# Patient Record
Sex: Female | Born: 1935 | Race: White | Hispanic: No
Health system: Southern US, Community
[De-identification: ages and names within clinical notes are randomized; demographics above are authoritative.]

---

## 2005-04-14 ENCOUNTER — Ambulatory Visit: Payer: Self-pay | Admitting: Internal Medicine

## 2005-12-03 ENCOUNTER — Ambulatory Visit: Payer: Self-pay | Admitting: Internal Medicine

## 2006-04-21 ENCOUNTER — Ambulatory Visit: Payer: Self-pay | Admitting: Internal Medicine

## 2006-09-02 ENCOUNTER — Ambulatory Visit: Payer: Self-pay | Admitting: Unknown Physician Specialty

## 2007-05-04 ENCOUNTER — Ambulatory Visit: Payer: Self-pay | Admitting: Internal Medicine

## 2007-09-02 ENCOUNTER — Ambulatory Visit: Payer: Self-pay

## 2008-04-18 ENCOUNTER — Ambulatory Visit: Payer: Self-pay | Admitting: Unknown Physician Specialty

## 2008-05-25 ENCOUNTER — Ambulatory Visit: Payer: Self-pay | Admitting: Internal Medicine

## 2009-06-06 ENCOUNTER — Ambulatory Visit: Payer: Self-pay | Admitting: Internal Medicine

## 2010-02-26 ENCOUNTER — Emergency Department: Payer: Self-pay | Admitting: Emergency Medicine

## 2010-06-17 ENCOUNTER — Ambulatory Visit: Payer: Self-pay | Admitting: Internal Medicine

## 2011-07-30 ENCOUNTER — Ambulatory Visit: Payer: Self-pay | Admitting: Internal Medicine

## 2011-10-30 ENCOUNTER — Ambulatory Visit: Payer: Self-pay | Admitting: Unknown Physician Specialty

## 2012-03-07 ENCOUNTER — Emergency Department: Payer: Self-pay | Admitting: Emergency Medicine

## 2012-03-07 LAB — CBC
MCH: 31.6 pg (ref 26.0–34.0)
Platelet: 230 10*3/uL (ref 150–440)
RBC: 4.03 10*6/uL (ref 3.80–5.20)
WBC: 8.5 10*3/uL (ref 3.6–11.0)

## 2012-03-07 LAB — COMPREHENSIVE METABOLIC PANEL
Albumin: 3.6 g/dL (ref 3.4–5.0)
Alkaline Phosphatase: 61 U/L (ref 50–136)
BUN: 7 mg/dL (ref 7–18)
Bilirubin,Total: 0.6 mg/dL (ref 0.2–1.0)
Calcium, Total: 8.3 mg/dL — ABNORMAL LOW (ref 8.5–10.1)
Creatinine: 0.56 mg/dL — ABNORMAL LOW (ref 0.60–1.30)
Osmolality: 248 (ref 275–301)
Potassium: 4 mmol/L (ref 3.5–5.1)
Sodium: 124 mmol/L — ABNORMAL LOW (ref 136–145)
Total Protein: 6.3 g/dL — ABNORMAL LOW (ref 6.4–8.2)

## 2012-03-07 LAB — TROPONIN I: Troponin-I: <0.02 ng/mL

## 2012-03-07 LAB — CK TOTAL AND CKMB (NOT AT ARMC): CK-MB: 1.9 ng/mL (ref 0.5–3.6)

## 2012-03-17 ENCOUNTER — Ambulatory Visit: Payer: Self-pay | Admitting: Unknown Physician Specialty

## 2012-05-06 ENCOUNTER — Ambulatory Visit: Payer: Self-pay | Admitting: Unknown Physician Specialty

## 2012-12-20 ENCOUNTER — Emergency Department: Payer: Self-pay | Admitting: Unknown Physician Specialty

## 2012-12-20 LAB — COMPREHENSIVE METABOLIC PANEL
Albumin: 3.9 g/dL (ref 3.4–5.0)
Alkaline Phosphatase: 79 U/L (ref 50–136)
Anion Gap: 6 — ABNORMAL LOW (ref 7–16)
BUN: 13 mg/dL (ref 7–18)
Bilirubin,Total: 0.4 mg/dL (ref 0.2–1.0)
Calcium, Total: 8.8 mg/dL (ref 8.5–10.1)
Chloride: 101 mmol/L (ref 98–107)
Co2: 29 mmol/L (ref 21–32)
Creatinine: 0.7 mg/dL (ref 0.60–1.30)
EGFR (African American): >60
EGFR (Non-African Amer.): >60
Glucose: 102 mg/dL — ABNORMAL HIGH (ref 65–99)
Potassium: 3.7 mmol/L (ref 3.5–5.1)
Sodium: 136 mmol/L (ref 136–145)

## 2012-12-20 LAB — URINALYSIS, COMPLETE
Bacteria: NONE SEEN
Glucose,UR: NEGATIVE mg/dL (ref 0–75)
Ketone: NEGATIVE
Nitrite: NEGATIVE
Protein: NEGATIVE
Specific Gravity: 1.008 (ref 1.003–1.030)
Squamous Epithelial: 1

## 2012-12-20 LAB — CBC
HGB: 13.9 g/dL (ref 12.0–16.0)
MCH: 31.6 pg (ref 26.0–34.0)
MCHC: 34.1 g/dL (ref 32.0–36.0)
MCV: 93 fL (ref 80–100)
Platelet: 283 10*3/uL (ref 150–440)
RBC: 4.38 10*6/uL (ref 3.80–5.20)
RDW: 13.2 % (ref 11.5–14.5)
WBC: 6.1 10*3/uL (ref 3.6–11.0)

## 2012-12-20 LAB — TROPONIN I: Troponin-I: <0.02 ng/mL

## 2012-12-20 LAB — MAGNESIUM: Magnesium: 1.8 mg/dL

## 2013-08-10 ENCOUNTER — Ambulatory Visit: Payer: Self-pay | Admitting: Family Medicine

## 2013-08-13 IMAGING — US ABDOMEN ULTRASOUND LIMITED
1 series · 13 of 25 positions shown · non-contrast
Comparison: none

REASON FOR EXAM: abd pain
COMMENTS:   Body Site: GB and Fossa, CBD, Head of Pancreas

[Series 1: abdomen ultrasound limited · 0.31mm/px · 13 of 55 slices shown]
[im 1/55]
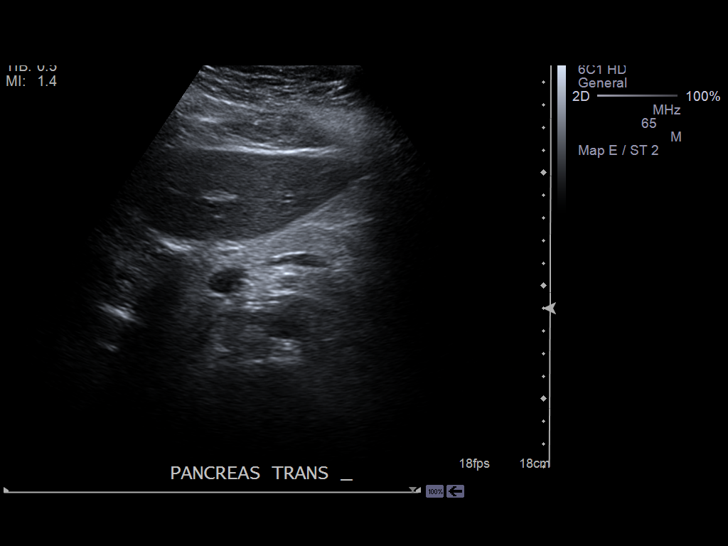
[im 5/55]
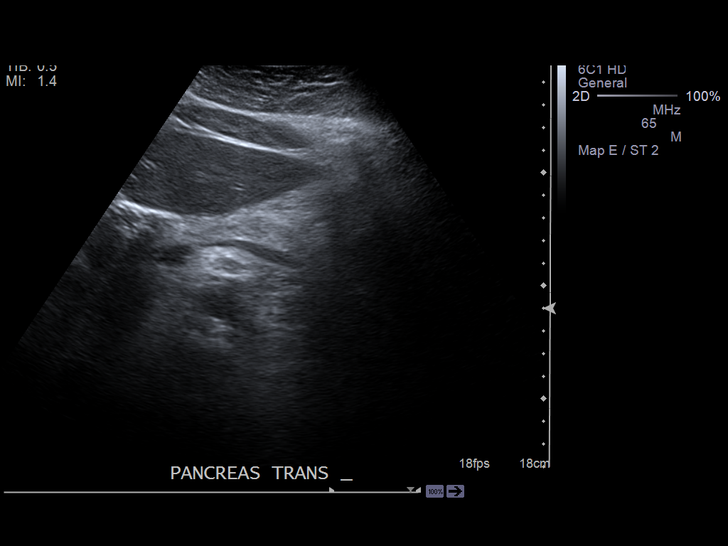
[im 10/55]
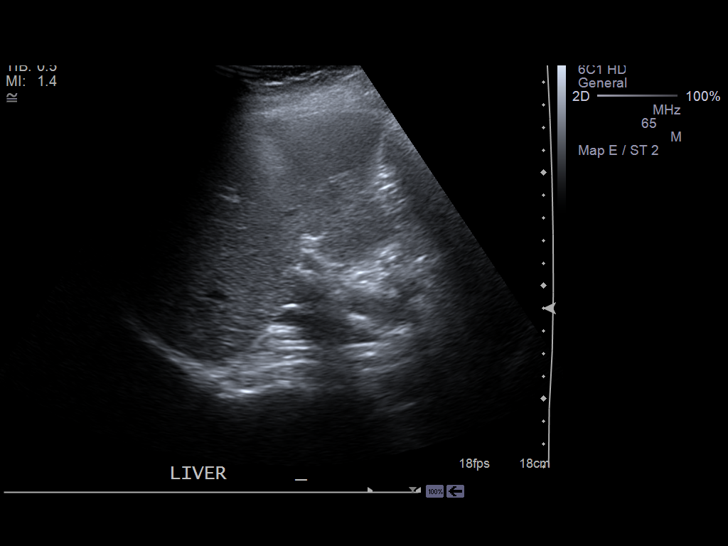
[im 14/55]
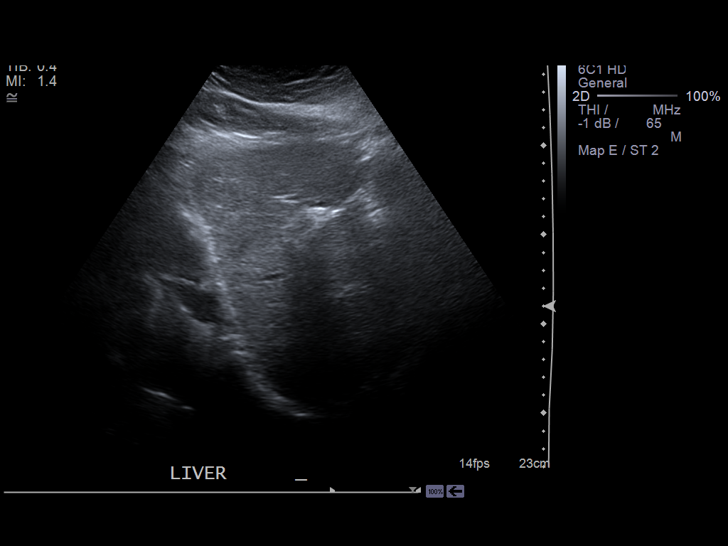
[im 19/55]
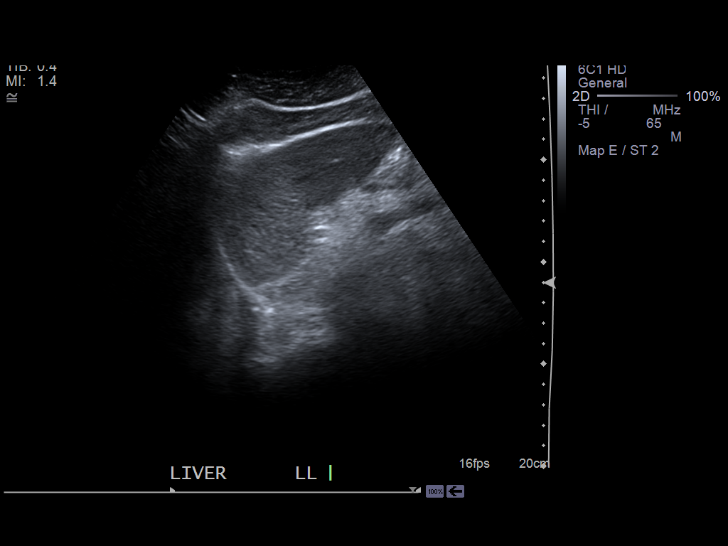
[im 23/55]
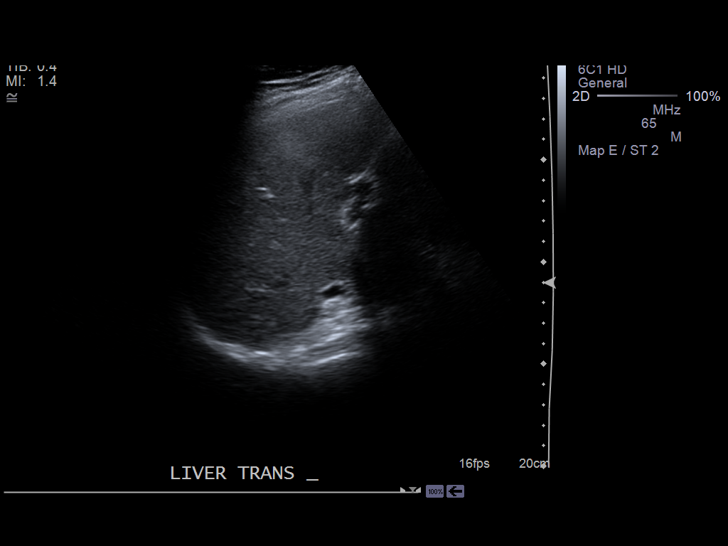
[im 28/55]
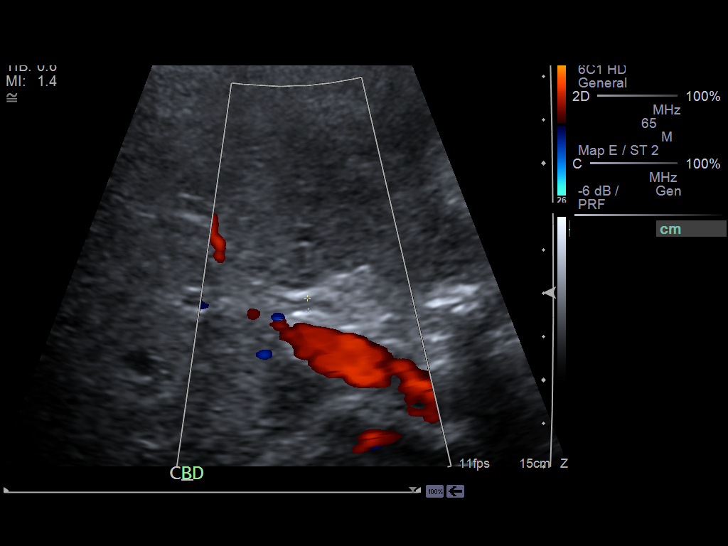
[im 32/55]
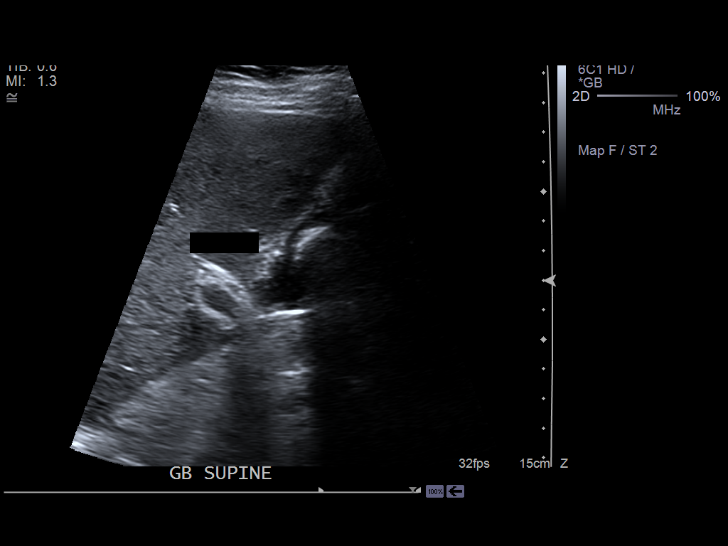
[im 37/55]
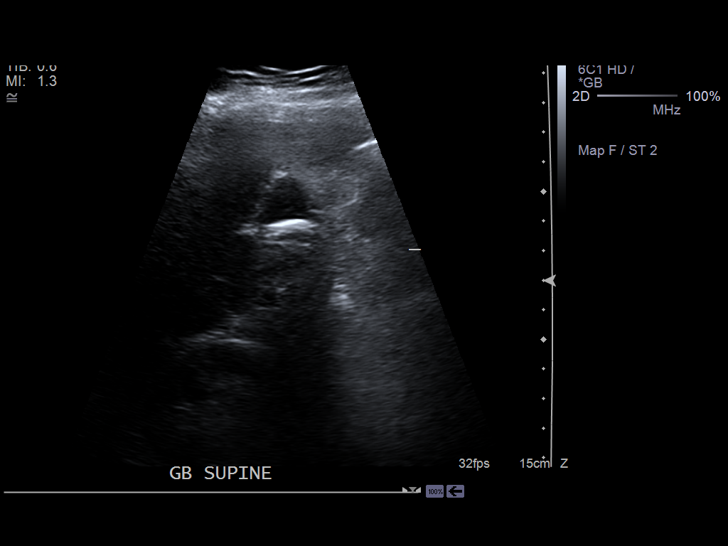
[im 41/55]
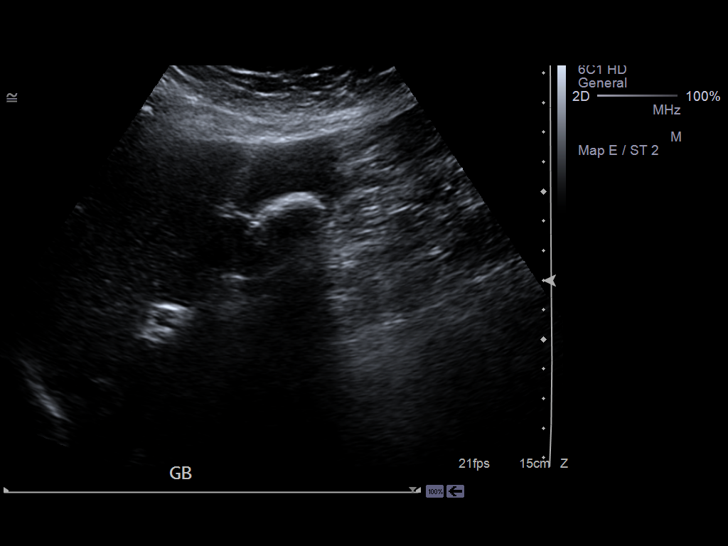
[im 46/55]
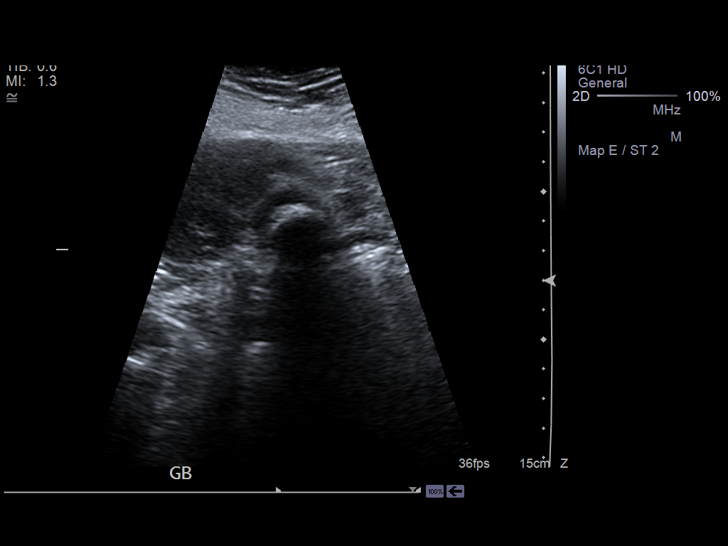
[im 50/55]
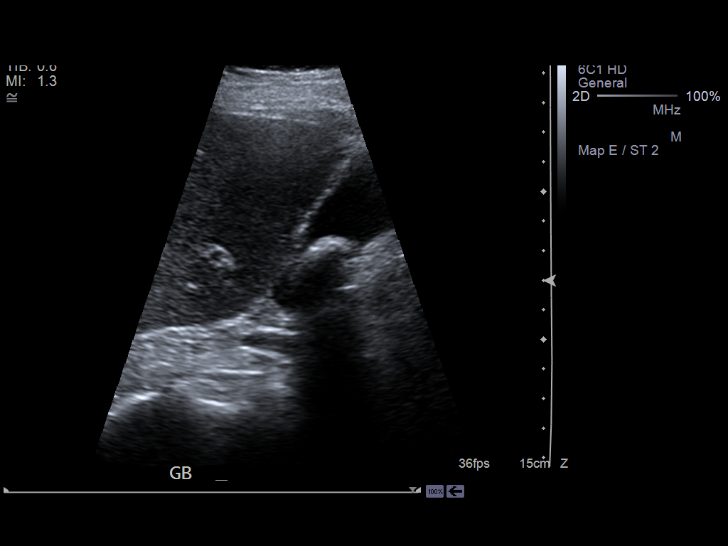
[im 55/55]
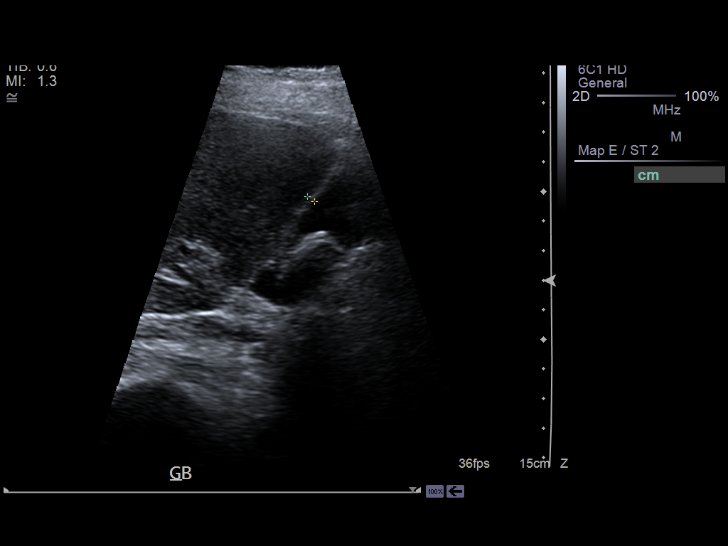

[13 of 25 positions shown; findings below may reference images not displayed]

PROCEDURE:     US  - US ABDOMEN LIMITED SURVEY  - December 20, 2012 [DATE]

RESULT:     The pancreas is predominantly the visualized with portions of
the tail and head were not well seen. The visualized pancreas is
unremarkable. The hepatic echotexture is normal the liver length of 14.30 cm
demonstrated. The portal venous flow is normal. There is no intrahepatic or
extrahepatic biliary ductal dilation. The common bile duct diameter is
mm. Cholelithiasis is present with at least one large stone present a wall
thickness is approximately 2.9 to 3.3 mm with some areas concerning for
edematous appearance. Correlate for acute cholecystitis. There was however,
a negative sonographic Murphy's sign. The stone was mobile with changes in
patient position.
IMPRESSION: Cholelithiasis. There appears to be a single large stone
measuring at least 2.9 cm which is mobile. The gallbladder wall is thickened
without is positive sonographic Murphy's sign. Changes of early
cholecystitis or not excluded. Close clinical and laboratory correlation is
recommended. Surgical consultation may be beneficial.

[REDACTED]

## 2014-04-01 ENCOUNTER — Emergency Department: Payer: Self-pay | Admitting: Emergency Medicine

## 2014-04-01 LAB — COMPREHENSIVE METABOLIC PANEL
ALK PHOS: 56 U/L
ALT: 26 U/L (ref 12–78)
Albumin: 3.4 g/dL (ref 3.4–5.0)
Anion Gap: 7 (ref 7–16)
BUN: 11 mg/dL (ref 7–18)
Bilirubin,Total: 1 mg/dL (ref 0.2–1.0)
CALCIUM: 9.1 mg/dL (ref 8.5–10.1)
CHLORIDE: 105 mmol/L (ref 98–107)
CREATININE: 0.88 mg/dL (ref 0.60–1.30)
Co2: 25 mmol/L (ref 21–32)
GFR CALC NON AF AMER: 60 — AB
Glucose: 99 mg/dL (ref 65–99)
Osmolality: 273 (ref 275–301)
Potassium: 4.2 mmol/L (ref 3.5–5.1)
SGOT(AST): 24 U/L (ref 15–37)
Sodium: 137 mmol/L (ref 136–145)
Total Protein: 6.6 g/dL (ref 6.4–8.2)

## 2014-04-01 LAB — CBC
HCT: 39.6 % (ref 35.0–47.0)
HGB: 13.6 g/dL (ref 12.0–16.0)
MCH: 31.4 pg (ref 26.0–34.0)
MCHC: 34.3 g/dL (ref 32.0–36.0)
MCV: 92 fL (ref 80–100)
PLATELETS: 245 10*3/uL (ref 150–440)
RBC: 4.33 10*6/uL (ref 3.80–5.20)
RDW: 13.5 % (ref 11.5–14.5)
WBC: 7.3 10*3/uL (ref 3.6–11.0)

## 2014-04-01 LAB — TROPONIN I: Troponin-I: <0.02 ng/mL

## 2014-04-01 LAB — CK TOTAL AND CKMB (NOT AT ARMC)
CK, Total: 53 U/L
CK-MB: 0.9 ng/mL (ref 0.5–3.6)

## 2014-04-07 ENCOUNTER — Ambulatory Visit: Payer: Self-pay | Admitting: Unknown Physician Specialty

## 2014-04-08 LAB — CBC
HCT: 39.5 % (ref 35.0–47.0)
HGB: 13.6 g/dL (ref 12.0–16.0)
MCH: 31.1 pg (ref 26.0–34.0)
MCHC: 34.3 g/dL (ref 32.0–36.0)
MCV: 91 fL (ref 80–100)
PLATELETS: 245 10*3/uL (ref 150–440)
RBC: 4.36 10*6/uL (ref 3.80–5.20)
RDW: 13.2 % (ref 11.5–14.5)
WBC: 10.5 10*3/uL (ref 3.6–11.0)

## 2014-04-09 ENCOUNTER — Inpatient Hospital Stay: Payer: Self-pay | Admitting: Internal Medicine

## 2014-04-09 LAB — BASIC METABOLIC PANEL
Anion Gap: 8 (ref 7–16)
BUN: 7 mg/dL (ref 7–18)
CREATININE: 0.73 mg/dL (ref 0.60–1.30)
Calcium, Total: 8.9 mg/dL (ref 8.5–10.1)
Chloride: 93 mmol/L — ABNORMAL LOW (ref 98–107)
Co2: 24 mmol/L (ref 21–32)
EGFR (African American): >60
EGFR (Non-African Amer.): >60
GLUCOSE: 101 mg/dL — AB (ref 65–99)
Osmolality: 250 (ref 275–301)
POTASSIUM: 3.7 mmol/L (ref 3.5–5.1)
Sodium: 125 mmol/L — ABNORMAL LOW (ref 136–145)

## 2014-04-09 LAB — HEPATIC FUNCTION PANEL A (ARMC)
ALBUMIN: 3.9 g/dL (ref 3.4–5.0)
ALK PHOS: 60 U/L
ALT: 29 U/L (ref 12–78)
Bilirubin, Direct: 0.1 mg/dL (ref 0.00–0.20)
Bilirubin,Total: 0.5 mg/dL (ref 0.2–1.0)
SGOT(AST): 29 U/L (ref 15–37)
Total Protein: 7.1 g/dL (ref 6.4–8.2)

## 2014-04-09 LAB — MAGNESIUM: MAGNESIUM: 1.5 mg/dL — AB

## 2014-04-09 LAB — CK TOTAL AND CKMB (NOT AT ARMC)
CK, TOTAL: 98 U/L
CK, Total: 128 U/L
CK, Total: 108 U/L
CK-MB: 3.2 ng/mL (ref 0.5–3.6)
CK-MB: 2.7 ng/mL (ref 0.5–3.6)
CK-MB: 2.5 ng/mL (ref 0.5–3.6)

## 2014-04-09 LAB — HEMOGLOBIN A1C: HEMOGLOBIN A1C: 6 % (ref 4.2–6.3)

## 2014-04-09 LAB — URINALYSIS, COMPLETE
BACTERIA: NONE SEEN
Bilirubin,UR: NEGATIVE
GLUCOSE, UR: NEGATIVE mg/dL (ref 0–75)
Ketone: NEGATIVE
Leukocyte Esterase: NEGATIVE
NITRITE: NEGATIVE
PH: 7 (ref 4.5–8.0)
Protein: NEGATIVE
RBC,UR: <1 /HPF (ref 0–5)
SQUAMOUS EPITHELIAL: NONE SEEN
Specific Gravity: 1.002 (ref 1.003–1.030)
WBC UR: NONE SEEN /HPF (ref 0–5)

## 2014-04-09 LAB — TROPONIN I
Troponin-I: 0.03 ng/mL
Troponin-I: 0.02 ng/mL

## 2014-04-09 LAB — SODIUM
SODIUM: 132 mmol/L — AB (ref 136–145)
Sodium: 136 mmol/L (ref 136–145)

## 2014-04-09 LAB — OSMOLALITY, URINE: Osmolality: 77 mOsm/kg

## 2014-04-09 LAB — LIPASE, BLOOD: Lipase: 212 U/L (ref 73–393)

## 2014-04-09 LAB — FOLATE: Folic Acid: 36.8 ng/mL (ref 3.1–100.0)

## 2014-04-10 LAB — CBC WITH DIFFERENTIAL/PLATELET
BASOS ABS: 0 10*3/uL (ref 0.0–0.1)
Basophil %: 0.7 %
EOS ABS: 0.1 10*3/uL (ref 0.0–0.7)
Eosinophil %: 2.4 %
HCT: 37.9 % (ref 35.0–47.0)
HGB: 12.4 g/dL (ref 12.0–16.0)
Lymphocyte #: 2.3 10*3/uL (ref 1.0–3.6)
Lymphocyte %: 40.5 %
MCH: 30.7 pg (ref 26.0–34.0)
MCHC: 32.6 g/dL (ref 32.0–36.0)
MCV: 94 fL (ref 80–100)
MONO ABS: 0.6 x10 3/mm (ref 0.2–0.9)
MONOS PCT: 10.3 %
Neutrophil #: 2.6 10*3/uL (ref 1.4–6.5)
Neutrophil %: 46.1 %
Platelet: 233 10*3/uL (ref 150–440)
RBC: 4.03 10*6/uL (ref 3.80–5.20)
RDW: 13.7 % (ref 11.5–14.5)
WBC: 5.6 10*3/uL (ref 3.6–11.0)

## 2014-04-10 LAB — BASIC METABOLIC PANEL
ANION GAP: 8 (ref 7–16)
BUN: 6 mg/dL — AB (ref 7–18)
CALCIUM: 8.5 mg/dL (ref 8.5–10.1)
CHLORIDE: 104 mmol/L (ref 98–107)
Co2: 26 mmol/L (ref 21–32)
Creatinine: 0.63 mg/dL (ref 0.60–1.30)
EGFR (African American): >60
EGFR (Non-African Amer.): >60
Glucose: 94 mg/dL (ref 65–99)
Osmolality: 273 (ref 275–301)
Potassium: 3.9 mmol/L (ref 3.5–5.1)
Sodium: 138 mmol/L (ref 136–145)

## 2014-04-10 LAB — TSH: Thyroid Stimulating Horm: 1.5 u[IU]/mL

## 2014-04-10 LAB — MAGNESIUM: Magnesium: 2 mg/dL

## 2014-04-11 LAB — PATHOLOGY REPORT

## 2014-04-13 ENCOUNTER — Emergency Department: Payer: Self-pay | Admitting: Emergency Medicine

## 2014-04-13 LAB — LIPASE, BLOOD: Lipase: 183 U/L (ref 73–393)

## 2014-04-13 LAB — COMPREHENSIVE METABOLIC PANEL
ALK PHOS: 62 U/L
Albumin: 3.7 g/dL (ref 3.4–5.0)
Anion Gap: 6 — ABNORMAL LOW (ref 7–16)
BUN: 10 mg/dL (ref 7–18)
Bilirubin,Total: 0.4 mg/dL (ref 0.2–1.0)
Calcium, Total: 9.4 mg/dL (ref 8.5–10.1)
Chloride: 100 mmol/L (ref 98–107)
Co2: 28 mmol/L (ref 21–32)
Creatinine: 0.83 mg/dL (ref 0.60–1.30)
EGFR (Non-African Amer.): >60
Glucose: 98 mg/dL (ref 65–99)
Osmolality: 267 (ref 275–301)
Potassium: 3.9 mmol/L (ref 3.5–5.1)
SGOT(AST): 35 U/L (ref 15–37)
SGPT (ALT): 41 U/L (ref 12–78)
Sodium: 134 mmol/L — ABNORMAL LOW (ref 136–145)
TOTAL PROTEIN: 7.3 g/dL (ref 6.4–8.2)

## 2014-04-13 LAB — CBC
HCT: 42.2 % (ref 35.0–47.0)
HGB: 13.6 g/dL (ref 12.0–16.0)
MCH: 30 pg (ref 26.0–34.0)
MCHC: 32.3 g/dL (ref 32.0–36.0)
MCV: 93 fL (ref 80–100)
PLATELETS: 287 10*3/uL (ref 150–440)
RBC: 4.54 10*6/uL (ref 3.80–5.20)
RDW: 13.1 % (ref 11.5–14.5)
WBC: 5.8 10*3/uL (ref 3.6–11.0)

## 2014-04-13 LAB — URINALYSIS, COMPLETE
BACTERIA: NONE SEEN
BILIRUBIN, UR: NEGATIVE
BLOOD: NEGATIVE
Glucose,UR: NEGATIVE mg/dL (ref 0–75)
KETONE: NEGATIVE
Leukocyte Esterase: NEGATIVE
NITRITE: NEGATIVE
Ph: 7 (ref 4.5–8.0)
Protein: NEGATIVE
SPECIFIC GRAVITY: 1.004 (ref 1.003–1.030)
Squamous Epithelial: 1

## 2014-04-18 ENCOUNTER — Inpatient Hospital Stay: Payer: Self-pay | Admitting: Internal Medicine

## 2014-04-18 LAB — CBC WITH DIFFERENTIAL/PLATELET
Basophil #: 0.1 10*3/uL (ref 0.0–0.1)
Basophil %: 0.5 %
Eosinophil #: 0.1 10*3/uL (ref 0.0–0.7)
Eosinophil %: 0.9 %
HCT: 37.5 % (ref 35.0–47.0)
HGB: 12.6 g/dL (ref 12.0–16.0)
LYMPHS PCT: 34.8 %
Lymphocyte #: 3.9 10*3/uL — ABNORMAL HIGH (ref 1.0–3.6)
MCH: 30.4 pg (ref 26.0–34.0)
MCHC: 33.5 g/dL (ref 32.0–36.0)
MCV: 91 fL (ref 80–100)
Monocyte #: 1.1 x10 3/mm — ABNORMAL HIGH (ref 0.2–0.9)
Monocyte %: 9.6 %
NEUTROS ABS: 6.1 10*3/uL (ref 1.4–6.5)
NEUTROS PCT: 54.2 %
PLATELETS: 258 10*3/uL (ref 150–440)
RBC: 4.14 10*6/uL (ref 3.80–5.20)
RDW: 13.5 % (ref 11.5–14.5)
WBC: 11.3 10*3/uL — AB (ref 3.6–11.0)

## 2014-04-18 LAB — BASIC METABOLIC PANEL
ANION GAP: 9 (ref 7–16)
Anion Gap: 6 — ABNORMAL LOW (ref 7–16)
Anion Gap: 8 (ref 7–16)
BUN: 6 mg/dL — ABNORMAL LOW (ref 7–18)
BUN: 5 mg/dL — ABNORMAL LOW (ref 7–18)
BUN: 4 mg/dL — AB (ref 7–18)
CALCIUM: 8.4 mg/dL — AB (ref 8.5–10.1)
CHLORIDE: 94 mmol/L — AB (ref 98–107)
CO2: 24 mmol/L (ref 21–32)
CREATININE: 0.61 mg/dL (ref 0.60–1.30)
Calcium, Total: 8.7 mg/dL (ref 8.5–10.1)
Calcium, Total: 8.4 mg/dL — ABNORMAL LOW (ref 8.5–10.1)
Chloride: 98 mmol/L (ref 98–107)
Chloride: 99 mmol/L (ref 98–107)
Co2: 24 mmol/L (ref 21–32)
Co2: 25 mmol/L (ref 21–32)
Creatinine: 0.75 mg/dL (ref 0.60–1.30)
Creatinine: 0.7 mg/dL (ref 0.60–1.30)
EGFR (African American): >60
EGFR (African American): >60
EGFR (Non-African Amer.): >60
EGFR (Non-African Amer.): >60
GLUCOSE: 103 mg/dL — AB (ref 65–99)
GLUCOSE: 146 mg/dL — AB (ref 65–99)
Glucose: 117 mg/dL — ABNORMAL HIGH (ref 65–99)
OSMOLALITY: 262 (ref 275–301)
Osmolality: 253 (ref 275–301)
Osmolality: 257 (ref 275–301)
Potassium: 4 mmol/L (ref 3.5–5.1)
Potassium: 4 mmol/L (ref 3.5–5.1)
Potassium: 3.5 mmol/L (ref 3.5–5.1)
SODIUM: 129 mmol/L — AB (ref 136–145)
Sodium: 127 mmol/L — ABNORMAL LOW (ref 136–145)
Sodium: 131 mmol/L — ABNORMAL LOW (ref 136–145)

## 2014-04-18 LAB — COMPREHENSIVE METABOLIC PANEL
ALBUMIN: 3.9 g/dL (ref 3.4–5.0)
AST: 25 U/L (ref 15–37)
Alkaline Phosphatase: 58 U/L
Anion Gap: 10 (ref 7–16)
BUN: 7 mg/dL (ref 7–18)
Bilirubin,Total: 0.6 mg/dL (ref 0.2–1.0)
CREATININE: 0.74 mg/dL (ref 0.60–1.30)
Calcium, Total: 8.6 mg/dL (ref 8.5–10.1)
Chloride: 88 mmol/L — ABNORMAL LOW (ref 98–107)
Co2: 25 mmol/L (ref 21–32)
EGFR (African American): >60
Glucose: 105 mg/dL — ABNORMAL HIGH (ref 65–99)
OSMOLALITY: 246 (ref 275–301)
Potassium: 4 mmol/L (ref 3.5–5.1)
SGPT (ALT): 39 U/L (ref 12–78)
Sodium: 123 mmol/L — ABNORMAL LOW (ref 136–145)
Total Protein: 6.9 g/dL (ref 6.4–8.2)

## 2014-04-18 LAB — TROPONIN I

## 2014-04-18 LAB — SODIUM, URINE, RANDOM: Sodium, Urine Random: 13 mmol/L (ref 20–110)

## 2014-04-18 LAB — OSMOLALITY, URINE: OSMOLALITY: 79 mosm/kg

## 2014-04-18 LAB — PRO B NATRIURETIC PEPTIDE: B-Type Natriuretic Peptide: 187 pg/mL (ref 0–450)

## 2014-04-18 LAB — OSMOLALITY: Osmolality: 255 mOsm/kg — ABNORMAL LOW (ref 280–301)

## 2014-04-19 LAB — CBC WITH DIFFERENTIAL/PLATELET
Basophil #: 0 10*3/uL (ref 0.0–0.1)
Basophil %: 0.7 %
EOS ABS: 0.1 10*3/uL (ref 0.0–0.7)
Eosinophil %: 1.5 %
HCT: 35.3 % (ref 35.0–47.0)
HGB: 12.2 g/dL (ref 12.0–16.0)
Lymphocyte #: 2.4 10*3/uL (ref 1.0–3.6)
Lymphocyte %: 38.6 %
MCH: 31.4 pg (ref 26.0–34.0)
MCHC: 34.5 g/dL (ref 32.0–36.0)
MCV: 91 fL (ref 80–100)
MONO ABS: 0.8 x10 3/mm (ref 0.2–0.9)
MONOS PCT: 12.8 %
NEUTROS ABS: 2.9 10*3/uL (ref 1.4–6.5)
NEUTROS PCT: 46.4 %
PLATELETS: 250 10*3/uL (ref 150–440)
RBC: 3.88 10*6/uL (ref 3.80–5.20)
RDW: 13.5 % (ref 11.5–14.5)
WBC: 6.2 10*3/uL (ref 3.6–11.0)

## 2014-04-19 LAB — TSH: Thyroid Stimulating Horm: 1.85 u[IU]/mL

## 2014-04-19 LAB — URIC ACID: Uric Acid: 3.2 mg/dL (ref 2.6–6.0)

## 2014-04-19 LAB — MAGNESIUM: MAGNESIUM: 2 mg/dL

## 2014-04-20 LAB — BASIC METABOLIC PANEL
ANION GAP: 6 — AB (ref 7–16)
BUN: 5 mg/dL — AB (ref 7–18)
CHLORIDE: 105 mmol/L (ref 98–107)
CO2: 27 mmol/L (ref 21–32)
Calcium, Total: 8.5 mg/dL (ref 8.5–10.1)
Creatinine: 0.6 mg/dL (ref 0.60–1.30)
EGFR (African American): >60
EGFR (Non-African Amer.): >60
Glucose: 99 mg/dL (ref 65–99)
Osmolality: 273 (ref 275–301)
Potassium: 4.2 mmol/L (ref 3.5–5.1)
SODIUM: 138 mmol/L (ref 136–145)

## 2014-05-09 ENCOUNTER — Ambulatory Visit: Payer: Self-pay | Admitting: Unknown Physician Specialty

## 2014-05-15 ENCOUNTER — Ambulatory Visit: Payer: Self-pay | Admitting: Surgery

## 2014-05-18 ENCOUNTER — Ambulatory Visit: Payer: Self-pay | Admitting: Surgery

## 2014-05-22 LAB — PATHOLOGY REPORT

## 2014-12-10 IMAGING — CR DG CHEST 1V PORT
1 series · 1 of 1 positions shown · non-contrast
Comparison: 04/08/2014 and 04/01/2014 radiographs.

CLINICAL DATA: Diffuse abdominal pain with lower extremity edema.

EXAM:
PORTABLE CHEST - 1 VIEW

[ap]
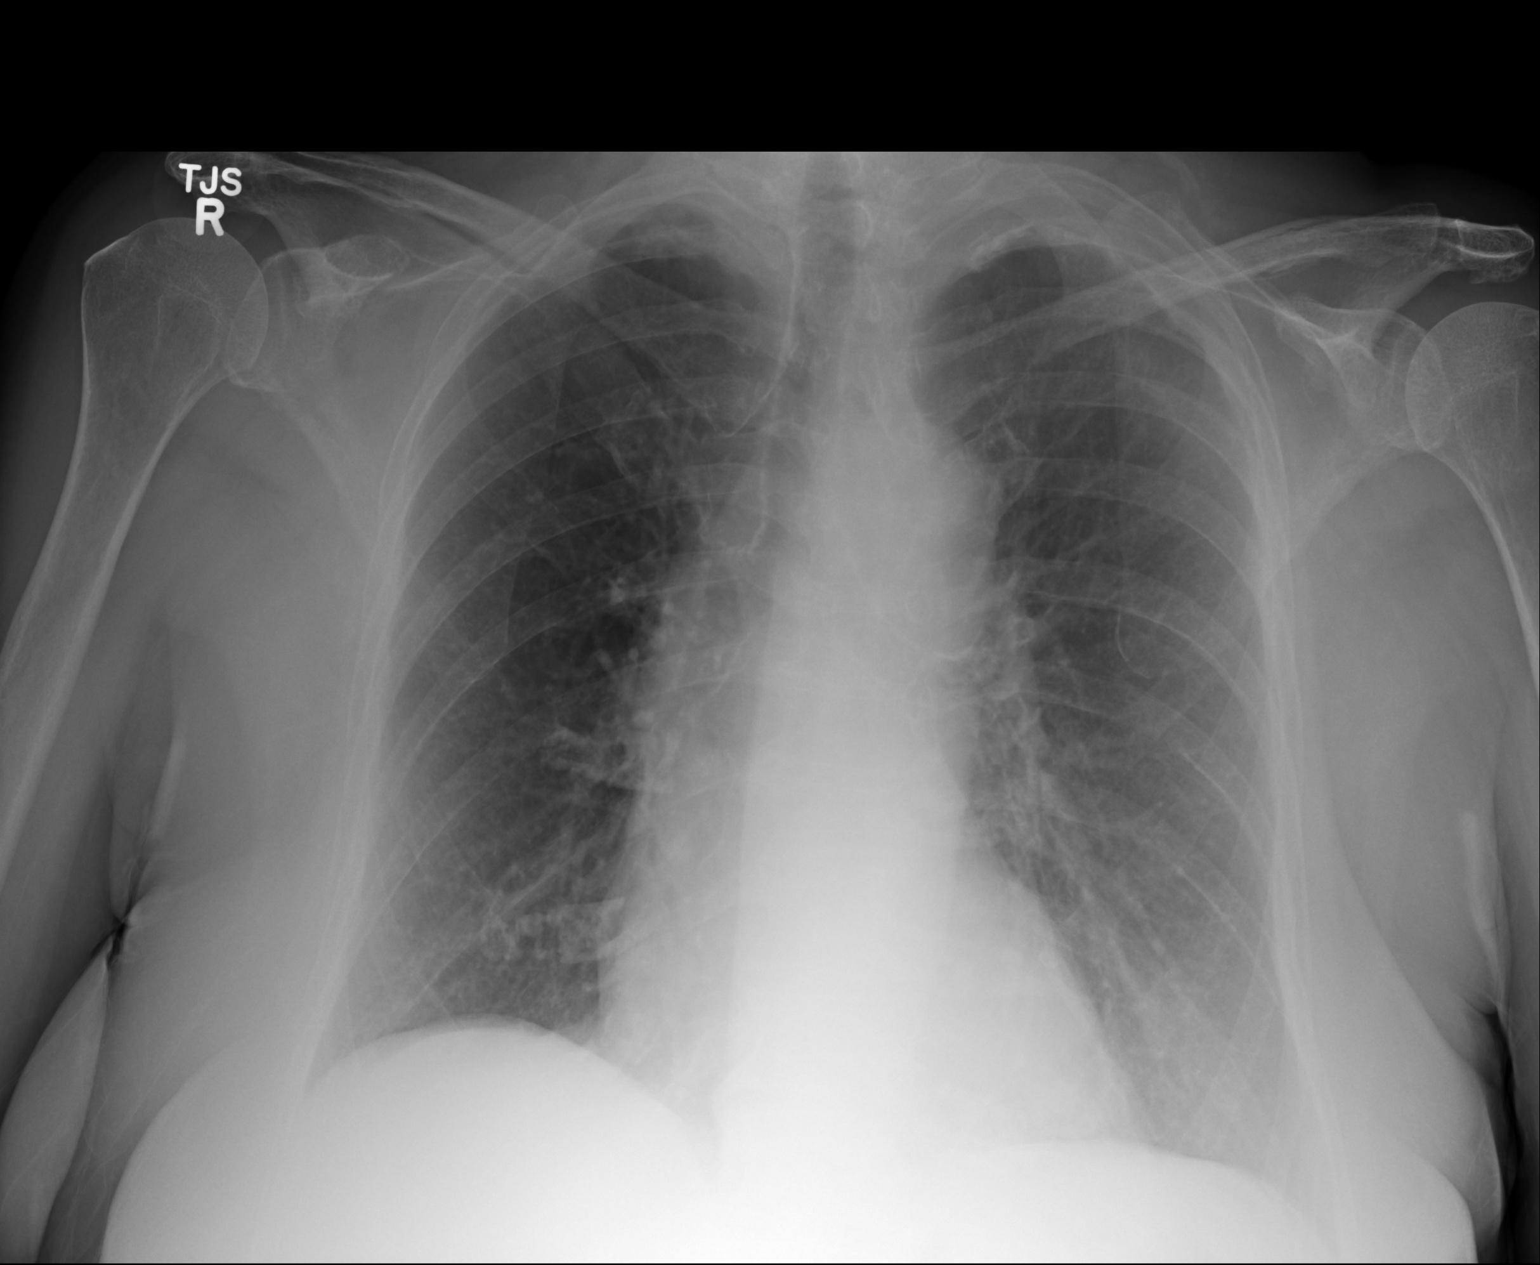

[1 of 1 positions shown; findings below may reference images not displayed]

FINDINGS: 5587 hr. The heart size and mediastinal contours are stable. There
is stable asymmetric left greater than right apical pleural
thickening with calcification. Underlying emphysematous changes are
stable. There is no edema, confluent airspace opacity or significant
pleural effusion.
IMPRESSION: Stable chronic lung disease. No evidence of pulmonary edema or other
acute process.
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
## 2015-01-20 NOTE — H&P (Signed)
PATIENT NAME:  [name], [name] MR#:  [id number] DATE OF BIRTH:  January 24, 1927  DATE OF ADMISSION:  04/09/2014  REFERRING PHYSICIAN: Dr. Loleta Rose.  PRIMARY CARE PHYSICIAN: Dr. Larwance Sachs.   CHIEF COMPLAINT: Not feeling well and bilateral feet numbness.   HISTORY OF PRESENT ILLNESS: This is an 81 year old female with significant past medical history of hyperlipidemia, hypertension, not taking any home medication, GERD, who presents with multiple complaints, mainly bilateral feet numbness started this evening. She denies any focal deficits, weakness, altered mental status, loss of consciousness, or any focal deficits, as well complaining not feeling well in her stomach as well and feeling generally weak. The patient had complaints of dysphagia where she was seen by Dr. Mechele Collin who did an EGD for her recently with no source of dysphasia found but she did have dilatation persistent during her EGD. The patient as well found to have hyponatremia at 125, which is new for her as her more recent lab done on 07/04 showing her sodium being 136. The patient reports that she has been drinking a lot of fluids recently. She drinks on average 2 to 3 liters per day over the last 2 days, as well, she reports having decreased oral intake. She as well, reports she is feeling sick in his stomach, but she denies any nausea, any vomiting, any diarrhea or constipation. No fever, no chills. As well patient was found to have uncontrolled blood pressure upon presentation. She does have history of hypertension, but she never required any medications. Systolic blood pressure was in the 200s.   PAST MEDICAL HISTORY:  1. Hyperlipidemia.  2. Hypertension.  3. Esophageal reflux.  4. Chronic constipation.  5. 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  6. History of palpitation.  7. History of gastritis and duodenitis.   PAST SURGICAL HISTORY:  1. Hysterectomy.  2. Appendectomy.   HOME MEDICATIONS:  1. Aspirin 81 mg daily.  2.  Calcium 600 with vitamin D.  3. CoQ10 at 100 mg 1 capsule daily.  4. Fish oil 1000 mg oral 2 times a day.  5. Lipitor 20 mg oral at bedtime.  6. Omeprazole 40 mg daily.   ALLERGIES: No known drug allergies.   SOCIAL HISTORY: Patient lives at home by himself. No smoking or drinking alcohol.   FAMILY HISTORY: Significant for leukemia in her father and congestive heart failure in the mother.   REVIEW OF SYSTEMS:   GENERAL: Denies fever, chills, complains of fatigue and weakness.  EYES: Denies blurry vision, double vision, inflammation, glaucoma.  EARS, NOSE, AND THROAT: Denies tinnitus, ear pain, hearing loss, epistaxis. RESPIRATORY: Denies cough, wheezing, hemoptysis, COPD.  CARDIOVASCULAR: Denies chest pain, edema, palpitation, syncope.  GASTROINTESTINAL: Denies nausea, vomiting, diarrhea, abdominal pain, hematemesis. GENITOURINARY: Denies dysuria, hematuria, renal colic.  ENDOCRINE: Denies polyuria, polydipsia, heat or cold intolerance.  HEMATOLOGY: Denies anemia, easy bruising, bleeding diathesis.  INTEGUMENT: Denies acne, rash.  MUSCULOSKELETAL: Denies any swelling, gout, cramps.  NEUROLOGIC: Denies history of CVA, TIA, tremors, vertigo. Reports bilateral feet numbness.  PSYCHIATRIC: Denies any anxiety, insomnia, or depression.   PHYSICAL EXAMINATION:  VITAL SIGNS: Temperature 98.4, pulse 69, respiratory rate 18, blood pressure 168/78, saturating 99% on room air.  GENERAL: Elderly frail female, looks comfortable in bed, in no apparent distress.  HEENT: Head atraumatic, normocephalic. Pupils are equal and reactive to light. Pink conjunctivae. Anicteric sclerae. Moist oral mucosa.  NECK: Supple. No thyromegaly. No JVD.  CHEST: Good air entry bilaterally. No wheezing, rales, rhonchi.  CARDIOVASCULAR: S1, S2 heard.  No rubs, murmurs, gallops.  ABDOMEN: Soft, nontender, nondistended. Bowel sounds present.  EXTREMITIES: No edema. No clubbing. No cyanosis. Motor 5/5. No focal deficits.  Negative Romberg's sign. Has steady gait and balance.  MUSCULOSKELETAL: No joint effusion or erythema.  SKIN: Normal skin turgor, warm and dry.   PERTINENT LABORATORY DATA: Glucose 101, BUN 7, creatinine 0.73, sodium 125, potassium 3.7, chloride 93, CO2 of 24. Troponin less than 0.02. White blood cells 10.5. Hemoglobin 13.6. Hematocrit 89.5. Platelets 245,000. Urinalysis negative for leukocyte esterase or nitrites.   IMAGING STUDIES: CT head without contrast showed no acute intracranial process.   ASSESSMENT AND PLAN:  1. 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 We will check urine osmolality and sodium level as well. We will fluid restrict her to 240 mL per shift. We will have her on normal saline. Will recheck her sodium level in a.m.  2. Bilateral feet numbness, the patient had no focal deficits. No acute finding on the CT head. We will check a B12 level, folic acid level, and hemoglobin A1c.  3. Hypertension. The patient does not have a history of high blood pressure but never on any medication and controlled. Will start her on p.o. hydralazine 10 mg q.i.d. and will add p.r.n. IV hydralazine as well, and it appears anxiety is contributing to it.  4. Anxiety. We will have the patient on p.r.n. Xanax.  5. Hyperlipidemia. Continue with statin.  6. Gastroesophageal reflux disease. Continue with PPI.   Deep vein thrombosis prophylaxis, subcutaneous heparin.    CODE STATUS: The patient reports she is full code. Her daughter is her healthcare power of attorney. She does not have a living will.  TOTAL TIME SPENT ON ADMISSION AND PATIENT CARE: 50 minutes.   ____________________________ Starleen Armsawood S. Ayris Carano, MD dse:lt D: 04/09/2014 03:33:29 ET T: 04/09/2014 04:33:50 ET JOB#: [id number]  cc: Starleen Armsawood S. Ndidi Nesby, MD, <Dictator> Kamsiyochukwu Spickler Teena IraniS Lavada Langsam  MD ELECTRONICALLY SIGNED 04/09/2014 5:25

## 2015-01-20 NOTE — Op Note (Signed)
PATIENT NAME:  Dawn [name], Dawn [name] MR#:  [id number] DATE OF BIRTH:  05/06/1927  DATE OF PROCEDURE:  05/18/2014  PREOPERATIVE DIAGNOSIS: Chronic cholecystitis/cholelithiasis.   POSTOPERATIVE DIAGNOSES: Chronic cholecystitis/cholelithiasis.   PROCEDURE: Laparoscopic cholecystectomy, cholangiogram.   SURGEON: Dawn RollsWilton Dazha Kempa, MD.   ANESTHESIA: General.   INDICATIONS: This 81 year old female has [name] history of epigastric pain and CT findings of [name] large gallstone and previous knowledge of gallstones. Surgery was recommended for definitive treatment.   DESCRIPTION OF PROCEDURE: The patient was placed on the operating table in the supine position under general endotracheal anesthesia. The abdomen was prepared with ChloraPrep, draped in [name] sterile manner.   [name] short incision was made in the inferior aspect of the umbilicus and carried down to the deep fascia, which was grasped with laryngeal hook and elevated. [name] Veress needle was inserted, aspirated, and irrigated with [name] saline solution. Next, the peritoneal cavity was inflated with carbon dioxide. The Veress needle was removed. [name] 10 mm cannula was inserted. The 10 mm, 0 degrees laparoscope was inserted to view the peritoneal cavity. The liver appeared smooth. The stomach appeared typical. There were [name] few adhesions between the omentum and the anterior abdominal wall in the left lower quadrant. Another incision was made in the epigastrium, slightly to the right of the midline to introduce an 11 mm cannula. Two incisions were made in the lateral aspect of the right upper quadrant to introduce two 5 mm cannulas.   The gallbladder was retracted towards the right shoulder. [name] number of adhesions were taken down with blunt and sharp dissection. The infundibulum was retracted inferiorly and laterally. The location of the porta hepatis was demonstrated. The gallbladder neck was mobilized with incision of the visceral peritoneum. The cystic artery was dissected free from  surrounding structures. This is directly overlying the gallbladder neck and was divided between clips for further exposure. Next, the cystic duct was dissected free from surrounding structures and an Endo Clip was placed across the cystic duct adjacent to the gallbladder neck. An incision was made in the cystic duct to introduce [name] Reddick catheter. Half-strength Conray-60 dye was injected as the cholangiogram was done with fluoroscopy, viewing the biliary tree and prompt flow of dye into the duodenum. No retained stones were seen. The Reddick catheter was removed. The cystic duct was doubly ligated with endoclips and divided. The gallbladder was dissected free from the liver with hook and cautery. There was no bleeding. The gallbladder was delivered up through the infraumbilical incision, opened and suctioned. There was [name] large stone in the gallbladder approximately 2 cm in dimension. It was not possible to crush it with the stone forceps and did lengthen the incision by approximately 1 cm and lengthen the fascial defect and with the additional traction, remove the gallbladder and  stone and was submitted for routine pathology. The right upper quadrant was further inspected, cannulas were removed. Carbon dioxide was allowed to escape from the peritoneal cavity. Several small subcutaneous bleeding points were cauterized. The deep fascia at the umbilicus was closed with 0 Vicryl interrupted figure-of-8 sutures. The skin incisions were closed with interrupted 5-0 chromic subcuticular sutures, benzoin, and Steri-Strips. Dressings were applied with paper tape. The patient tolerated surgery satisfactorily and was then prepared for transfer to the recovery room.    ____________________________ Dawn CommonsJ. Dawn RollsWilton Hughie Melroy, MD jws:ls D: 05/18/2014 14:41:00 ET T: 05/18/2014 18:40:12 ET JOB#: [id number]  cc: Adella HareJ. Dawn Jaleyah Longhi, MD, <Dictator> Adella HareWILTON J Eliezer Khawaja MD ELECTRONICALLY SIGNED 05/20/2014 13:46

## 2015-01-20 NOTE — Discharge Summary (Signed)
PATIENT NAMDimple [name]:  Garduno, Garrett A MR#:  [id number] DATE OF BIRTH:  Jun 01, 1927  DATE OF ADMISSION:  04/18/2014 DATE OF DISCHARGE:  04/20/2014  DISCHARGE DIAGNOSES: 1.  Hyponatremia.  2.  Cholelithiasis.  3.  Hypertension.  4.  Gastroesophageal reflux disease. 5.  Hyperlipidemia.   CONDITION: Stable.  CODE STATUS: FULL CODE.   HOME MEDICATIONS: Please refer to the medication reconciliation list.   The patient needs home health with a nurse and outpatient PT; however, the patient's declined.   DIET: Low sodium diet.   ACTIVITY: As tolerated.   FOLLOW-UP CARE: Follow up with PCP and Dr. Michela PitcherEly within 1- 2 weeks. The patient needs follow-up with Dr. Michela PitcherEly for cholelithiasis.   REASON FOR ADMISSION: Generalized weakness and tingling and numbness of bilateral feet.    HOSPITAL COURSE:  The patient is an 81 year old Caucasian female with a history of hypertension, chronic constipation, hyperlipidemia, and GERD, presented to the ED with generalized weakness, numbness, and tingling of  bilateral feet.   The patient was noticed to have hyponatremia with a sodium at 123. The patient received normal saline bolus in the ED. Abdominal series has revealed a moderate amount of stool in the abdomen. For detailed history and physical examination, please refer to the admission note dictated by Dr. Amado CoeGouru.  Laboratory data showed sodium 123 on July 16. Potassium is normal. Chloride 88, bicarbonate and GFR were normal.   Chest x-ray: No evidence of pulmonary edema. Abdominal series, unremarkable bowel gas pattern, cholelithiasis is present.   1. Generalized weakness, possibly due to hyponatremia. After admission, the patient has been treated with normal saline IV and fluid restriction. The patient's sodium has gradually improved. Sodium increased to 138  today. Patient's symptoms have improved.   2.  Hypertension, controlled.   3.  Constipation. The patient had a bowel movement yesterday.   4.  Cholelithiasis.   Patient needs to follow-up certainly as outpatient.   The patient has no complaints. Her vital signs are stable. She is clinically stable and will be discharged to home today. I discussed the patient's discharge plan with the patient, nurse, case manager. The patient in home health and outpatient PT; however, the patient declined.   TIME SPENT: About 42 minutes.    ____________________________ Shaune PollackQing Peggie Hornak, MD qc:ts D: 04/20/2014 14:12:36 ET T: 04/20/2014 15:11:52 ET JOB#: [id number]  cc: Shaune PollackQing Andrez Lieurance, MD, <Dictator> Shaune PollackQING Mabeline Varas MD ELECTRONICALLY SIGNED 04/21/2014 17:05

## 2015-01-20 NOTE — Discharge Summary (Signed)
PATIENT NAMDimple [name]:  [name], [name] MR#:  [id number] DATE OF BIRTH:  [birth year]  DATE OF ADMISSION:  04/09/2014 DATE OF DISCHARGE:  04/10/2014  ADMITTING DIAGNOSES: Hyponatremia, paresthesias due to hyponatremia.  DISCHARGE DIAGNOSES:  1. Paresthesias, resolved, likely due to hyponatremia. 2. Hyponatremia, likely dehydration, resolved on IV fluids. 3. Hypertension.  4. History of gastroesophageal reflux disease.  5. Constipation.  6. Hyperlipidemia.   DISCHARGE CONDITION: Stable.   DISCHARGE MEDICATIONS: The patient is to continue omeprazole 40 mg p.o. daily, aspirin 81 mg p.o. daily, fish oil 1 gram twice daily, calcium with vitamin D 1 tablet twice daily, atorvastatin 10 mg p.o. at bedtime, Coenzyme Q10 100 mg p.o. twice daily.   NEW MEDICATIONS: Carafate 1 gram 4 times daily before meals and at bedtime, amlodipine 5 mg p.o. daily, and psyllium 1 packet once daily as needed, colace 2 caps twice daily.  HOME OXYGEN: None.   DIET: Regular, regular consistency.   ACTIVITY LIMITATIONS: As tolerated.  FOLLOWUP APPOINTMENT: With Dr. Larwance SachsBabaoff in 2 days after discharge. Patient was advised to drink to plenty of fluids to stay hydrated.   CONSULTANTS: Care management, social work.   RADIOLOGIC STUDIES: Chest x-ray, PA and lateral, 04/08/2014, showed emphysematous and chronic bronchitis changes in the lungs. No evidence of active pulmonary disease. CT scan of head without contrast 04/08/2014 revealed no acute intracranial process. Normal noncontrast CT scan of head for the age.  HISTORY OF PRESENT ILLNESS: The patient is an 81 year old Caucasian female with [name] past medical history significant for history of gastroesophageal reflux disease, who presents to the hospital with complaints of not feeling well as well as having bilateral feet numbness. Please refer to Dr. Teena IraniElgergawy's admission note on 04/09/2014.   PHYSICAL EXAMINATION: VITALS SIGNS: On arrival to the hospital, the patient was noted to  have [name] temperature of 98.4, pulse was 69, respiratory rate was 18, blood pressure 168/78, saturation was 99% on room air.   Physical exam was unremarkable.   LABORATORY DATA: The patient's lab data done on arrival to the hospital revealed [name] low sodium level of 125, her glucose level was mildly elevated at 101. Magnesium level was low at 1.5. Liver enzymes were normal. The patient's cardiac enzymes x 3 were within normal limits. CBC was within normal limits with white blood cell count of 10.5, hemoglobin 13.6, platelet count is 245,000. Urine random osmolality was 77. Urinalysis was unremarkable. The patient's EKG showed normal sinus rhythm at 73 beats per minute, premature atrial complexes, otherwise normal EKG. The patient was admitted to the hospital with diagnosis of numbness, paresthesias, and vitamin B12 level was checked as well as CT scan of the head. It was felt; however, that patient's paresthesias could be related to hyponatremia, so patient was initiated on [name] normal saline infusion due to concerns of possible element of dehydration although patient admitted drinking plenty of fluids at home, mostly water. The patient's vitamin B12 level was found to be normal at 490. Folic acid level was not checked. The patient was initiated on IV fluids and her sodium level improved. In the morning of July 2015 the patient's sodium level was already 132 and by the end of 04/09/2014 it was 136. The patient's sodium level improved to 138 on the day of discharge, 04/10/2014. It was felt that patient's hyponatremia could have been related to water overload as well as possibly some element of dehydration. The patient was advised to continue followup with Dr. Larwance SachsBabaoff sodium levels as outpatient. With resolution of  patient's hyponatremia, the patient's paresthesias resolved as well. It was felt that patient's paresthesias were most likely related to hyponatremia. The patient was noted to be hypotensive on arrival to the  hospital. She was initiated on Norvasc for blood pressure management. On the day of discharge, 04/12/2014 the patient's blood pressure was much better controlled. Her vital signs overall showed temperature of 97.6, pulse was 66, respiration rate was 18, blood pressure 119/68, saturation was 96% on room air at rest. It is recommended to follow the patient's blood pressure readings and advance the patient's blood pressure medications higher if needed.   In regards to gastroesophageal reflux disease, the patient is to continue PPI. However, Carafate was ordered to help her with gastroesophageal reflux.  For constipation, the patient was initiated on psyllium 1 packet daily as needed. For hyperlipidemia, the patient is to continue her usual doses of atorvastatin. The patient is being discharged to home with the above-mentioned medications and followup.   TIME SPENT: 40 minutes on this patient.    ____________________________ Katharina Caper, MD rv:lt D: 04/10/2014 17:22:10 ET T: 04/11/2014 04:33:09 ET JOB#: [id number]  cc: Katharina Caper, MD, <Dictator> Kandyce Rud, MD Eustacia Urbanek MD ELECTRONICALLY SIGNED 04/24/2014 4:55

## 2015-01-20 NOTE — Op Note (Signed)
PATIENT NAME:  Dawn [name], Dawn [name] MR#:  [id number] DATE OF BIRTH:  03-20-1927  DATE OF PROCEDURE:  00/20/2015  PREOPERATIVE DIAGNOSIS: Chronic cholecystitis/cholelithiasis.   POSTOPERATIVE DIAGNOSES: Chronic cholecystitis/cholelithiasis.   PROCEDURE: Laparoscopic cholecystectomy, cholangiogram.   SURGEON: Renda RollsWilton Smith, MD.   ANESTHESIA: General.   INDICATIONS: This [age] year old female has [name] history of epigastric pain and CT findings of [name] large gallstone and previous knowledge of gallstones. Surgery was recommended for definitive treatment.   DESCRIPTION OF PROCEDURE: The patient was placed on the operating table in the supine position under general endotracheal anesthesia. The abdomen was prepared with ChloraPrep, draped in [name] sterile manner.   [name] short incision was made in the inferior aspect of the umbilicus and carried down to the deep fascia, which was grasped with laryngeal hook and elevated. [name] Veress needle was inserted, aspirated, and irrigated with [name] saline solution. Next, the peritoneal cavity was inflated with carbon dioxide. The Veress needle was removed. [name] 10 mm cannula was inserted. The 10 mm, 0 degrees laparoscope was inserted to view the peritoneal cavity. The liver appeared smooth. The stomach appeared typical. There were [name] few adhesions between the omentum and the anterior abdominal wall in the left lower quadrant. Another incision was made in the epigastrium, slightly to the right of the midline to introduce an 11 mm cannula. Two incisions were made in the lateral aspect of the right upper quadrant to introduce two 5 mm cannulas.   The gallbladder was retracted towards the right shoulder. [name] number of adhesions were taken down with blunt and sharp dissection. The infundibulum was retracted inferiorly and laterally. The location of the porta hepatis was demonstrated. The gallbladder neck was mobilized with incision of the visceral peritoneum. The cystic artery was dissected free from  surrounding structures. This is directly overlying the gallbladder neck and was divided between clips for further exposure. Next, the cystic duct was dissected free from surrounding structures and an Endo Clip was placed across the cystic duct adjacent to the gallbladder neck. An incision was made in the cystic duct to introduce [name] Reddick catheter. Half-strength Conray-60 dye was injected as the cholangiogram was done with fluoroscopy, viewing the biliary tree and prompt flow of dye into the duodenum. No retained stones were seen. The Reddick catheter was removed. The cystic duct was doubly ligated with endoclips and divided. The gallbladder was dissected free from the liver with hook and cautery. There was no bleeding. The gallbladder was delivered up through the infraumbilical incision, opened and suctioned. There was [name] large stone in the gallbladder approximately 2 cm in dimension. It was not possible to crush it with the stone forceps and did lengthen the incision by approximately 1 cm and lengthen the fascial defect and with the additional traction, remove the gallbladder and  stone and was submitted for routine pathology. The right upper quadrant was further inspected, cannulas were removed. Carbon dioxide was allowed to escape from the peritoneal cavity. Several small subcutaneous bleeding points were cauterized. The deep fascia at the umbilicus was closed with 0 Vicryl interrupted figure-of-8 sutures. The skin incisions were closed with interrupted 5-0 chromic subcuticular sutures, benzoin, and Steri-Strips. Dressings were applied with paper tape. The patient tolerated surgery satisfactorily and was then prepared for transfer to the recovery room.    ____________________________ [name]. Renda RollsWilton Smith, MD jws:ls D: 05/18/2014 14:41:18 ET T: 05/18/2014 18:40:12 ET JOB#: [id number]  cc: Adella HareJ. Wilton Smith, MD, <Dictator>

## 2015-01-20 NOTE — H&P (Signed)
PATIENT NAMEKALECIA, [name] MR#:  [id number] DATE OF BIRTH:  [birth year]  DATE OF ADMISSION:  04/18/2014  PRIMARY CARE PHYSICIAN: Dr. Larwance [name].  REFERRING PHYSICIAN: Dr. Dolores [name].   CHIEF COMPLAINT: Generalized weakness and tingling and numbness of bilateral feet.   HISTORY OF PRESENT ILLNESS: The patient is an 81 year old pleasant Caucasian female who lives alone, is presenting to the ED with a chief complaint of generalized weakness associated with abdominal discomfort, bloating, and bilateral feet tingling and numbness since yesterday morning. The patient was just recently admitted to the hospital with similar complaints on July 12th and was found to be hyponatremic with a sodium of 125. At the time of discharge during her admission, her sodium went up to 136 after giving IV fluids. During this admission, her sodium was at 123. She has received 1 liter of fluid bolus in the ED. Abdominal series has revealed moderate amount of the stool in the abdomen. The patient reports that she feels constipated and she has a bowel movement, but she passes very little amount of stool. As the patient was feeling weak and having abdominal bloating, her niece who came to visit her has brought her into the ED. The patient denies any chest pain or shortness of breath.   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   PAST SURGICAL HISTORY: Hysterectomy, appendectomy.   ALLERGIES: She has no known drug allergies.   PSYCHOSOCIAL HISTORY: Lives at home by herself. No history of smoking, alcohol, or illicit drug usage.   FAMILY HISTORY: Her father has leukemia and mother has a history of congestive heart failure.   HOME MEDICATIONS: Aspirin 81 mg p.o. once daily, calcium 600 mg with vitamin D once daily, fish oil 1000 mg 2 tablets 2 times a day, amlodipine 5 mg p.o. once daily,   omeprazole 40 mg p.o. once daily, psyllium 1 packet once a day as needed.   REVIEW OF SYSTEMS:  CONSTITUTIONAL: Denies any fever, fatigue. Complaining of generalized weakness and lower extremity tingling and numbness. No weight loss or weight gain.  EYES: Denies blurry vision, double vision, glaucoma.  ENT: Denies epistaxis, discharge, tinnitus, or ear pain.  RESPIRATORY: Denies cough, COPD. Denies any history of hemoptysis.  CARDIOVASCULAR: Denies any chest pain, palpitations, or syncope.  GASTROINTESTINAL: Denies nausea, vomiting, diarrhea. Complaining of lower abdominal discomfort and bloating.   GENITOURINARY: No dysuria or hematuria or urinary frequency. Denies any renal colic.  ENDOCRINE: Denies polyuria, nocturia, thyroid problems, has history of polydipsia.  HEMATOLOGIC: Denies anemia, easy bruising, or bleeding.  INTEGUMENTARY: No acne, rash, lesions.  MUSCULOSKELETAL: No joint pain in the neck and back. Denies any history of gout.  NEUROLOGIC: Denies vertigo, ataxia, CVA, TIA, or tremors.  PSYCHIATRIC: Denies any ADD, OCD, depression. The patient seemed to be anxious.  PHYSICAL EXAMINATION:  VITAL SIGNS: Temperature is 98, pulse 84, respirations 18, blood pressure 149/66, pulse oximetry is 99%.  GENERAL APPEARANCE: Not in acute distress. Moderately built and nourished.  HEENT: Normocephalic, atraumatic. Pupils are equally reacting to light and accommodation. No scleral icterus. No conjunctival injection. No sinus tenderness. No postnasal drip. Moist mucosal membranes.  NECK: Supple. No JVD. No thyromegaly. Range of motion is intact.  LUNGS: Clear to auscultation bilaterally. No accessory muscle usage. No anterior chest wall tenderness on palpation.  CARDIAC: S1 and S2 normal. Regular rate and rhythm, no murmurs.  GASTROINTESTINAL: Soft. Bowel sounds are positive in  all 4 quadrants. Nontender, nondistended but positive lower abdominal discomfort. No rebound tenderness. No masses  felt.  NEUROLOGIC: Awake, alert, oriented x 3. Cranial nerves II through XII are grossly intact. Motor and sensory are intact. Reflexes are 2+.  EXTREMITIES: Trace edema is present. No cyanosis. No clubbing.  SKIN: Warm to touch. Normal turgor. No rashes. No lesions.  MUSCULOSKELETAL: No joint effusion, tenderness, erythema.  PSYCHIATRIC: Flat mood and affect.   LABORATORY AND IMAGING STUDIES: LFTs are normal. Troponin less than 0.02. CBC: WBC is at 11.3, hematocrit 37.7, platelets are 258,000. Chem-8: Glucose 105, BMP is normal. BUN and creatinine are normal. Sodium at 123 on 16th of July. Sodium at 134, potassium is normal, chloride 88, CO2 and GFR are normal. Anion gap, serum osmolality, and calcium are normal.   A 12-lead EKG: Normal sinus rhythm at 87 beats per minute with premature supraventricular complexes and a PR interval is prolonged at 214 with first-degree AV block. Abdominal series: Unremarkable bowel gas pattern. No free intra-abdominal air. A small-to-moderate amount of stool noted in the colon without significant radiographic evidence of constipation. Cholelithiasis is present. Chest x-ray, portable: No evidence of pulmonary edema or other acute process. Stable chronic lung disease.   ASSESSMENT AND PLAN: An 81 year old Caucasian female presenting to the ED with a chief complaint of generalized weakness and bilateral feet paresthesias since yesterday a.m. The patient's sodium is found to be 123 and abdominal x-ray has revealed a moderate amount of stool in the colon.   ASSESSMENT AND PLAN:  1. Generalized weakness associated with paresthesias probably secondary to hyponatremia secondary to dehydration versus psychogenic polydipsia. We will provide her gentle hydration with IV fluids. Will check serial BMPs.  2. We will check urine sodium and osmolality. If there is no improvement, will consider nephrology consult.  3. Bilateral feet paresthesias are assumed to be from hyponatremia.   4. Lower abdominal discomfort secondary to constipation. Will provide her lactulose twice a day and then after a good bowel movement we will change it to p.r.n.  8. Past history of hypertension. Continue amlodipine.  9. Hyperlipidemia. Continue statin.  10. Gastroesophageal reflux disease. will give ranitidine.  11. Deep vein thrombosis prophylaxis with Lovenox.   Plan of care was discussed in detail with the patient and her niece at her bedside. They both verbalized understanding of the plan.   CODE STATUS: She is full code. Daughter is the medical power of attorney. Daughter lives at [place] and coming to see her mom in a.m.  TOTAL TIME SPENT ON ADMISSION: 50 minutes.   ____________________________ Ramonita LabAruna Tianni Escamilla, MD ag:lt D: 04/18/2014 04:01:57 ET T: 04/18/2014 04:25:49 ET JOB#: [id number]  cc: Ramonita LabAruna Mackinzie Vuncannon, MD, <Dictator> Ramonita LabARUNA Penney Domanski MD ELECTRONICALLY SIGNED 04/19/2014 5:36

## 2022-09-29 DEATH — deceased
